# Patient Record
Sex: Male | Born: 1980 | Race: White | Hispanic: No | Marital: Single | Smoking: Never smoker
Health system: Southern US, Community
[De-identification: ages and names within clinical notes are randomized; demographics above are authoritative.]

## PROBLEM LIST (undated history)

## (undated) HISTORY — PX: APPENDECTOMY: SHX54

---

## 2019-04-23 ENCOUNTER — Other Ambulatory Visit: Payer: Self-pay | Admitting: Family Medicine

## 2019-04-23 ENCOUNTER — Ambulatory Visit
Admission: RE | Admit: 2019-04-23 | Discharge: 2019-04-23 | Disposition: A | Discharge status: Home / Self Care | Payer: BC Managed Care – PPO | Source: Ambulatory Visit | Attending: Family Medicine | Admitting: Family Medicine

## 2019-04-23 ENCOUNTER — Other Ambulatory Visit: Payer: Self-pay

## 2019-04-23 DIAGNOSIS — N451 Epididymitis: Secondary | ICD-10-CM | POA: Insufficient documentation

## 2020-05-03 ENCOUNTER — Ambulatory Visit
Admission: EM | Admit: 2020-05-03 | Discharge: 2020-05-03 | Disposition: A | Discharge status: Home / Self Care | Payer: BC Managed Care – PPO | Attending: Internal Medicine | Admitting: Internal Medicine

## 2020-05-03 ENCOUNTER — Encounter: Payer: Self-pay | Admitting: Emergency Medicine

## 2020-05-03 ENCOUNTER — Other Ambulatory Visit: Payer: Self-pay

## 2020-05-03 DIAGNOSIS — H6500 Acute serous otitis media, unspecified ear: Secondary | ICD-10-CM

## 2020-05-03 DIAGNOSIS — H6020 Malignant otitis externa, unspecified ear: Secondary | ICD-10-CM | POA: Diagnosis not present

## 2020-05-03 MED ORDER — IBUPROFEN 600 MG PO TABS: 600.0000 mg | ORAL_TABLET | Freq: Four times a day (QID) | ORAL | 0 refills | Status: AC | PRN

## 2020-05-03 MED ORDER — HYDROCORTISONE-ACETIC ACID 1-2 % OT SOLN: 3.0000 [drp] | Freq: Three times a day (TID) | OTIC | 0 refills | Status: AC

## 2020-05-03 NOTE — ED Triage Notes (Signed)
Patient c/o left ear fullness and pain that started last week.  Patient reports left sided facial swelling that started today.  Patient states that he started an antibiotic on Friday.  Patient denies fevers.

## 2020-05-03 NOTE — ED Provider Notes (Signed)
[facility]    CSN: [id number] Arrival date & time: 05/03/20  1252      History   Chief Complaint Chief Complaint  Patient presents with  . Otalgia    left    HPI Trevor Mcintosh is a 39 y.o. male comes to urgent care on account of worsening left ear fullness with pain.  Patient symptoms started last week.  He was seen by his primary care physician and started on antibiotics on Friday 9/3.Marland Kitchen  Twenty four hours after patient started taking antibiotics the left ear swelling and pain seems to have worsened.  He has diminished hearing in the left ear.  No fever or chills.  No nausea or vomiting.   No ringing in the ears.  No spinning sensation  HPI  History reviewed. No pertinent past medical history.  There are no problems to display for this patient.   Past Surgical History:  Procedure Laterality Date  . APPENDECTOMY         Home Medications    Prior to Admission medications   Medication Sig Start Date End Date Taking? Authorizing Provider  amoxicillin-clavulanate (AUGMENTIN) 875-125 MG tablet SMARTSIG:1 Tablet(s) By Mouth Every 12 Hours 04/30/20  Yes [provider]  gabapentin (NEURONTIN) 100 MG capsule Take 100 mg by mouth 3 (three) times daily. 04/01/20  Yes [provider]  nortriptyline (PAMELOR) 50 MG capsule Take by mouth. 10/26/18  Yes [provider]  verapamil (VERELAN PM) 240 MG 24 hr capsule Take by mouth. 12/23/19  Yes [provider]  acetic acid-hydrocortisone (VOSOL-HC) OTIC solution Place 3 drops into the left ear 3 (three) times daily for 3 days. 05/03/20 05/06/20  Merrilee Jansky, MD  ibuprofen (ADVIL) 600 MG tablet Take 1 tablet (600 mg total) by mouth every 6 (six) hours as needed. 05/03/20   Fayola Meckes, Britta Mccreedy, MD    Family History History reviewed. No pertinent family history.  Social History Social History   Tobacco Use  . Smoking status: Never Smoker  . Smokeless tobacco: Never Used  Vaping Use  . Vaping  Use: Never used  Substance Use Topics  . Alcohol use: Not Currently  . Drug use: Never     Allergies   Patient has no known allergies.   Review of Systems Review of Systems  Constitutional: Positive for fever. Negative for chills.  HENT: Positive for congestion, sinus pressure, sinus pain and sore throat. Negative for trouble swallowing.   Respiratory: Negative.   Cardiovascular: Negative.   Gastrointestinal: Negative.   Neurological: Negative for dizziness, facial asymmetry, light-headedness, numbness and headaches.     Physical Exam Triage Vital Signs ED Triage Vitals  Enc Vitals Group     BP 05/03/20 1334 (!) 150/102     Pulse Rate 05/03/20 1334 (!) 111     Resp 05/03/20 1334 16     Temp 05/03/20 1334 99.5 F (37.5 C)     Temp Source 05/03/20 1334 Oral     SpO2 05/03/20 1334 99 %     Weight 05/03/20 1331 (!) 318 lb (144.2 kg)     Height 05/03/20 1331 6' (1.829 m)     Head Circumference --      Peak Flow --      Pain Score 05/03/20 1331 0     Pain Loc --      Pain Edu? --      Excl. in GC? --    No data found.  Updated Vital Signs BP Marland Kitchen)  150/102 (BP Location: Left Arm)   Pulse (!) 111   Temp 99.5 F (37.5 C) (Oral)   Resp 16   Ht 6' (1.829 m)   Wt (!) 144.2 kg   SpO2 99%   BMI 43.13 kg/m   Visual Acuity Right Eye Distance:   Left Eye Distance:   Bilateral Distance:    Right Eye Near:   Left Eye Near:    Bilateral Near:     Physical Exam Vitals and nursing note reviewed.  Constitutional:      General: He is in acute distress.     Appearance: He is ill-appearing.  HENT:     Ears:     Comments: Left external ear canal is edematous and erythematous but without any discharge.  Tympanic membrane was barely visualized. Cardiovascular:     Rate and Rhythm: Normal rate.     Pulses: Normal pulses.     Heart sounds: Normal heart sounds.  Pulmonary:     Effort: Pulmonary effort is normal.     Breath sounds: Normal breath sounds.  Skin:     Capillary Refill: Capillary refill takes less than 2 seconds.  Neurological:     General: No focal deficit present.     Mental Status: He is alert and oriented to person, place, and time.      UC Treatments / Results  Labs (all labs ordered are listed, but only abnormal results are displayed) Labs Reviewed - No data to display  EKG   Radiology No results found.  Procedures Procedures (including critical care time)  Medications Ordered in UC Medications - No data to display  Initial Impression / Assessment and Plan / UC Course  I have reviewed the triage vital signs and the nursing notes.  Pertinent labs & imaging results that were available during my care of the patient were reviewed by me and considered in my medical decision making (see chart for details).     1.  Acute otitis media with otitis externa in the left ear: Add VoSoL to the treatment regimen If symptoms worsens, patient is welcome to return to the urgent care to be reevaluated. Continue current antibiotic regimen  Final Clinical Impressions(s) / UC Diagnoses   Final diagnoses:  Acute malignant otitis externa, unspecified laterality  Acute serous otitis media, recurrence not specified, unspecified laterality   Discharge Instructions   None    ED Prescriptions    Medication Sig Dispense Auth. Provider   acetic acid-hydrocortisone (VOSOL-HC) OTIC solution Place 3 drops into the left ear 3 (three) times daily for 3 days. 10 mL Paxson Harrower, Britta Mccreedy, MD   ibuprofen (ADVIL) 600 MG tablet Take 1 tablet (600 mg total) by mouth every 6 (six) hours as needed. 30 tablet Jessie Cowher, Britta Mccreedy, MD     PDMP not reviewed this encounter.   Merrilee Jansky, MD 05/03/20 1440

## 2020-10-05 IMAGING — US ULTRASOUND SCROTUM DOPPLER COMPLETE
1 series · 14 of 25 positions shown · non-contrast
Comparison: None.

CLINICAL DATA: Epididymitis.  Left testicular pain.

EXAM:
SCROTAL ULTRASOUND
DOPPLER ULTRASOUND OF THE TESTICLES
TECHNIQUE: Complete ultrasound examination of the testicles, epididymis, and
other scrotal structures was performed. Color and spectral Doppler
ultrasound were also utilized to evaluate blood flow to the
testicles.

[Series 1: ultrasound scrotum doppler complete · 0.08mm/px · 14 of 65 slices shown]
[im 1/65]
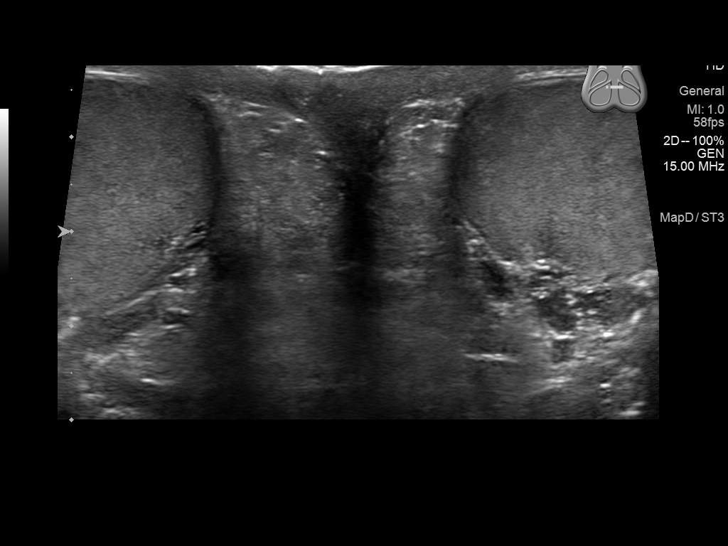
[im 6/65]
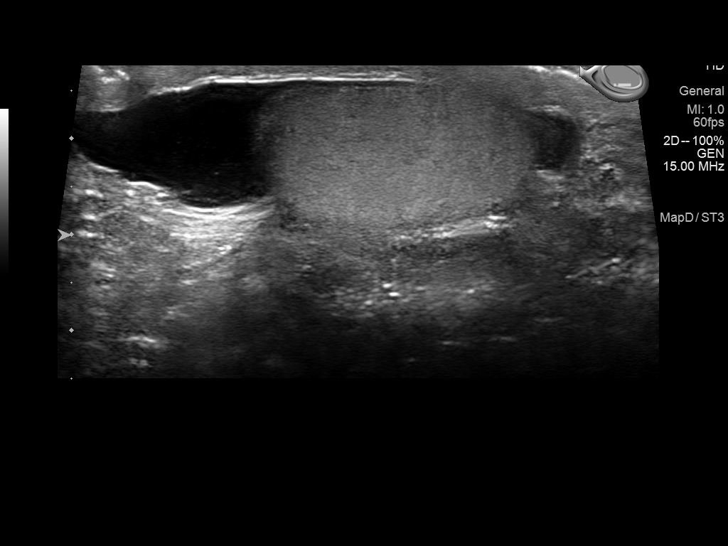
[im 11/65]
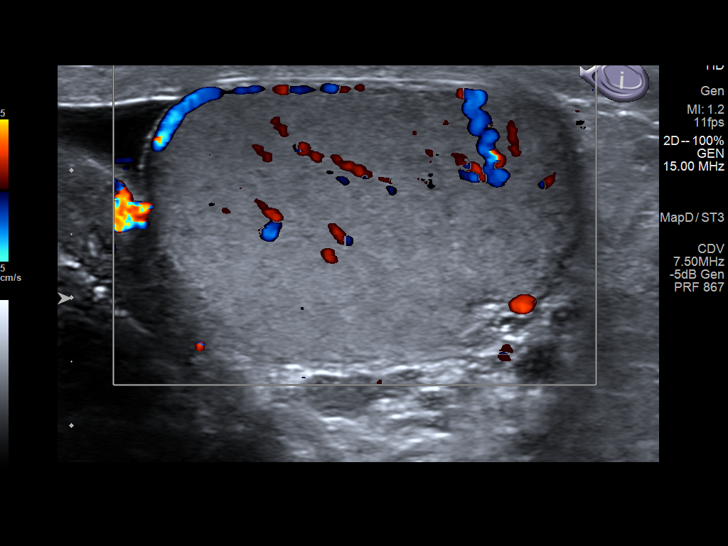
[im 17/65]
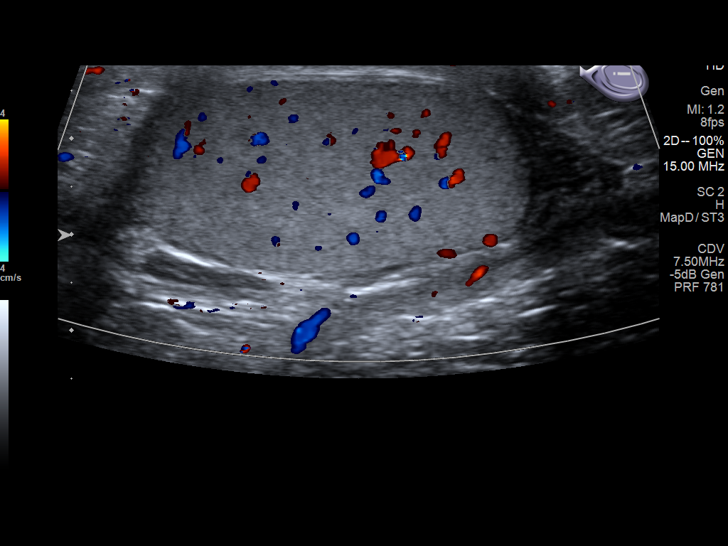
[im 22/65]
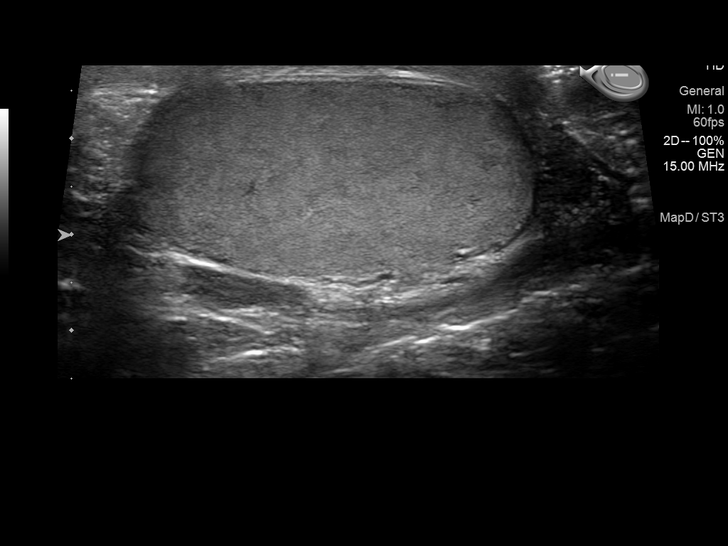
[im 25/65]
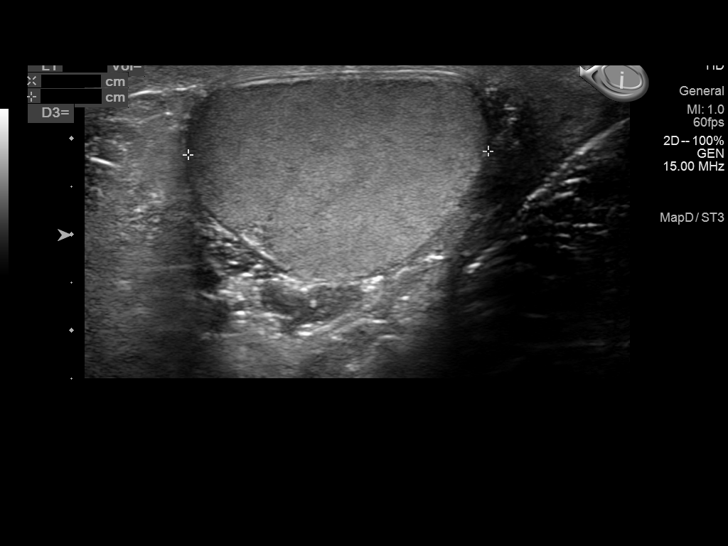
[im 30/65]
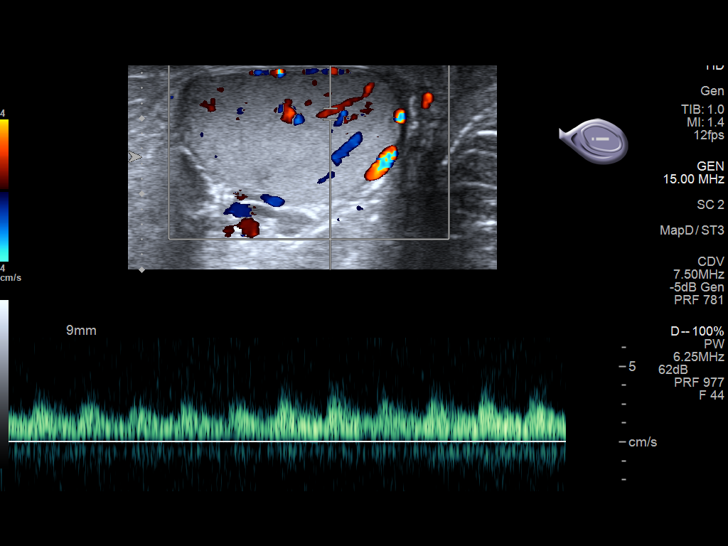
[im 35/65]
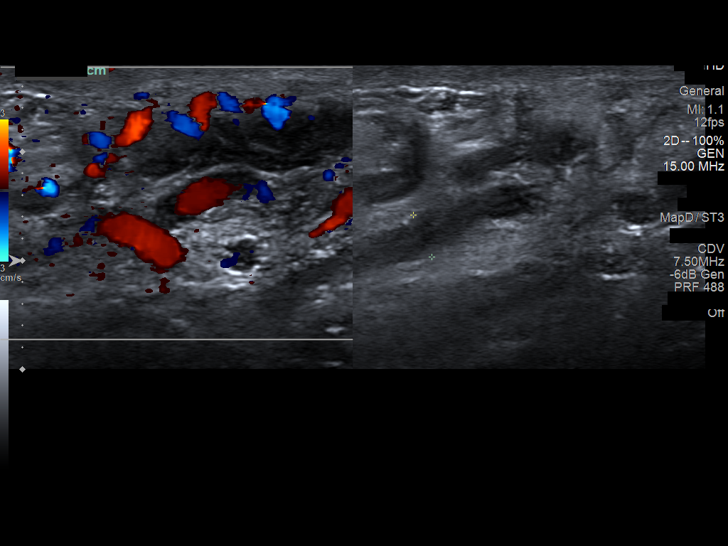
[im 41/65]
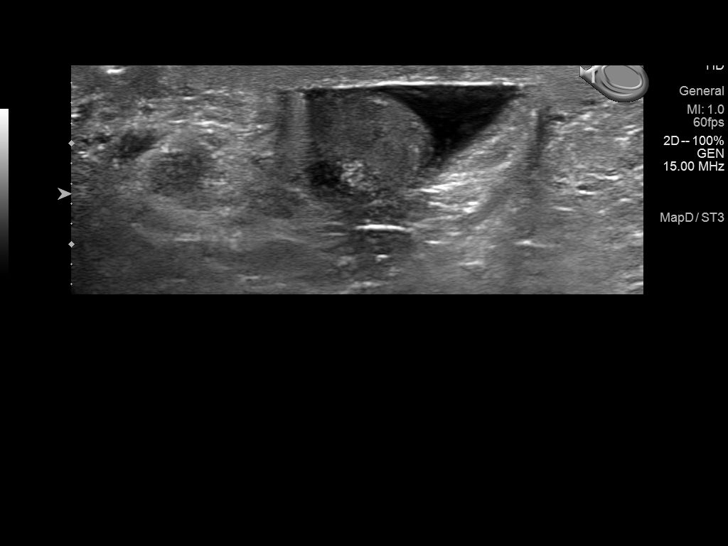
[im 43/65]
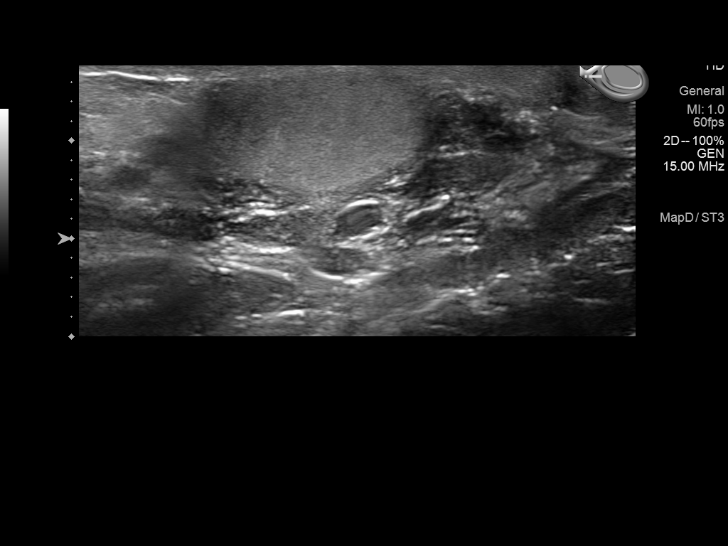
[im 49/65]
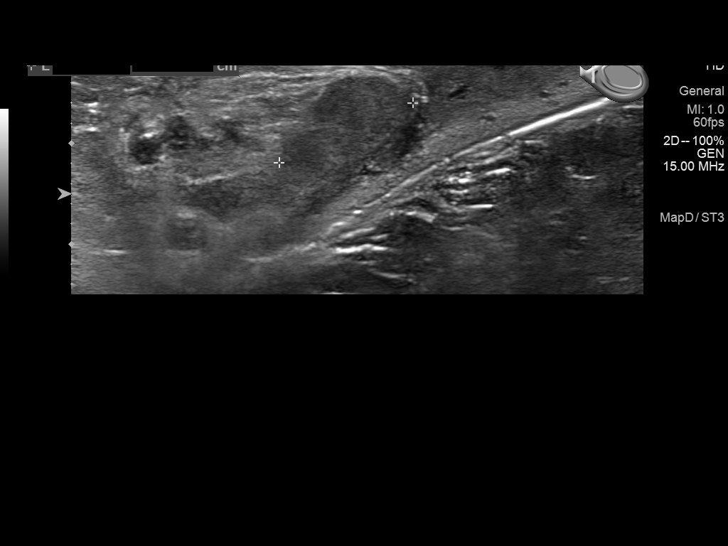
[im 54/65]
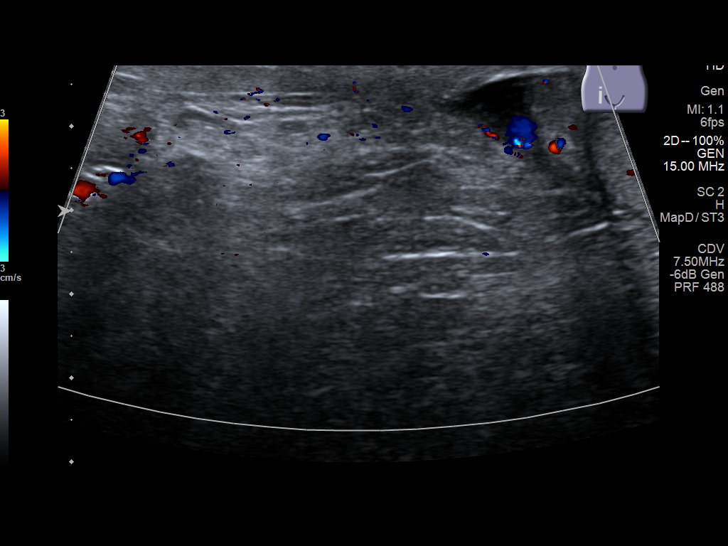
[im 59/65]
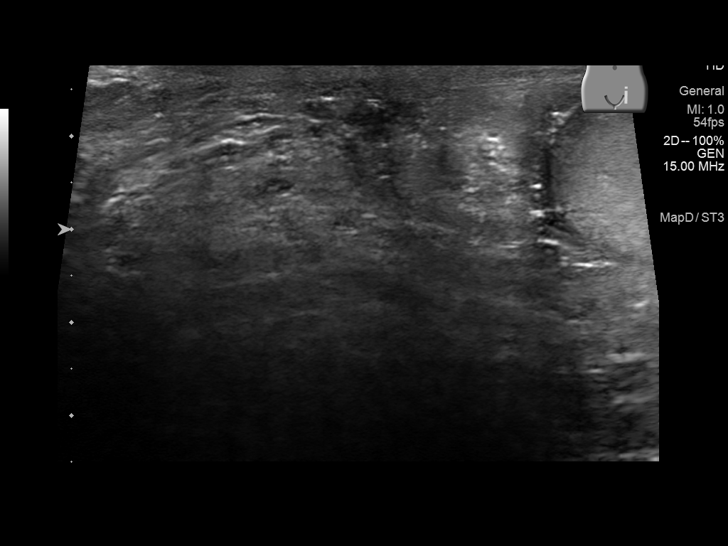
[im 65/65]
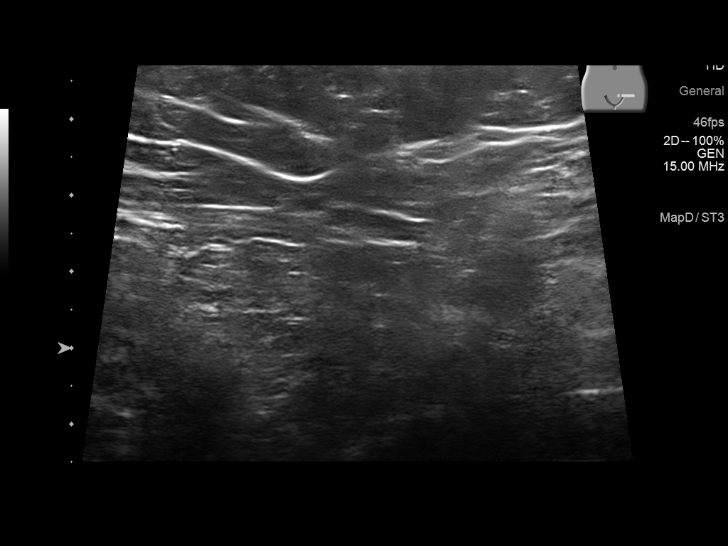

[14 of 25 positions shown; findings below may reference images not displayed]

FINDINGS: Right testicle

Measurements: 4.5 x 3.4 x 2.2 cm. No mass or microlithiasis
visualized.

Left testicle

Measurements: 4.2 x 3.1 x 2.1 cm. No mass or microlithiasis
visualized.

Right epididymis:  Normal in size and appearance.

Left epididymis:  Normal in size and appearance.

Hydrocele:  Small bilateral hydroceles are noted.

Varicocele:  Small left varicocele is noted.

Pulsed Doppler interrogation of both testes demonstrates normal low
resistance arterial and venous waveforms bilaterally.
IMPRESSION: No evidence of testicular mass or torsion. No definite evidence of
epididymitis. Small bilateral hydroceles are noted. Small left
varicocele is noted.

## 2023-09-25 ENCOUNTER — Other Ambulatory Visit: Payer: Self-pay | Admitting: Family Medicine

## 2023-09-25 DIAGNOSIS — M25475 Effusion, left foot: Secondary | ICD-10-CM

## 2023-09-27 ENCOUNTER — Ambulatory Visit
Admission: RE | Admit: 2023-09-27 | Discharge: 2023-09-27 | Disposition: A | Discharge status: Home / Self Care | Payer: BC Managed Care – PPO | Source: Ambulatory Visit | Attending: Family Medicine | Admitting: Family Medicine

## 2023-09-27 DIAGNOSIS — M25475 Effusion, left foot: Secondary | ICD-10-CM | POA: Insufficient documentation
